# Patient Record
Sex: Male | Born: 1990 | Race: White | Hispanic: No | Marital: Married | Smoking: Current every day smoker
Health system: Southern US, Community
[De-identification: ages and names within clinical notes are randomized; demographics above are authoritative.]

## PROBLEM LIST (undated history)

## (undated) DIAGNOSIS — N201 Calculus of ureter: Secondary | ICD-10-CM

## (undated) HISTORY — PX: TYMPANOSTOMY TUBE PLACEMENT: SHX32

---
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
## 2015-01-03 ENCOUNTER — Emergency Department (HOSPITAL_COMMUNITY)
Admission: EM | Admit: 2015-01-03 | Discharge: 2015-01-04 | Disposition: A | Discharge status: Home / Self Care | Payer: 59 | Attending: Emergency Medicine | Admitting: Emergency Medicine

## 2015-01-03 ENCOUNTER — Emergency Department (HOSPITAL_COMMUNITY): Payer: 59

## 2015-01-03 ENCOUNTER — Encounter (HOSPITAL_COMMUNITY): Payer: Self-pay

## 2015-01-03 DIAGNOSIS — R112 Nausea with vomiting, unspecified: Secondary | ICD-10-CM | POA: Insufficient documentation

## 2015-01-03 DIAGNOSIS — Z720 Tobacco use: Secondary | ICD-10-CM | POA: Insufficient documentation

## 2015-01-03 DIAGNOSIS — R109 Unspecified abdominal pain: Secondary | ICD-10-CM | POA: Diagnosis present

## 2015-01-03 DIAGNOSIS — R1031 Right lower quadrant pain: Secondary | ICD-10-CM

## 2015-01-03 LAB — CBC WITH DIFFERENTIAL/PLATELET
BASOS PCT: 1 % (ref 0–1)
Basophils Absolute: 0 10*3/uL (ref 0.0–0.1)
Eosinophils Absolute: 0.1 10*3/uL (ref 0.0–0.7)
Eosinophils Relative: 1 % (ref 0–5)
HEMATOCRIT: 38.7 % — AB (ref 39.0–52.0)
Hemoglobin: 13.5 g/dL (ref 13.0–17.0)
LYMPHS ABS: 3.6 10*3/uL (ref 0.7–4.0)
LYMPHS PCT: 45 % (ref 12–46)
MCH: 31.9 pg (ref 26.0–34.0)
MCHC: 34.9 g/dL (ref 30.0–36.0)
MCV: 91.5 fL (ref 78.0–100.0)
MONOS PCT: 5 % (ref 3–12)
Monocytes Absolute: 0.4 10*3/uL (ref 0.1–1.0)
NEUTROS ABS: 3.9 10*3/uL (ref 1.7–7.7)
Neutrophils Relative %: 48 % (ref 43–77)
Platelets: 218 10*3/uL (ref 150–400)
RBC: 4.23 MIL/uL (ref 4.22–5.81)
RDW: 12.7 % (ref 11.5–15.5)
WBC: 8.1 10*3/uL (ref 4.0–10.5)

## 2015-01-03 LAB — BASIC METABOLIC PANEL
Anion gap: 9 (ref 5–15)
BUN: 12 mg/dL (ref 6–23)
CHLORIDE: 106 mmol/L (ref 96–112)
CO2: 27 mmol/L (ref 19–32)
CREATININE: 1.01 mg/dL (ref 0.50–1.35)
Calcium: 9.2 mg/dL (ref 8.4–10.5)
GFR calc Af Amer: >90 mL/min (ref >90)
GFR calc non Af Amer: >90 mL/min (ref >90)
Glucose, Bld: 119 mg/dL — ABNORMAL HIGH (ref 70–99)
POTASSIUM: 3.5 mmol/L (ref 3.5–5.1)
Sodium: 142 mmol/L (ref 135–145)

## 2015-01-03 MED ORDER — MORPHINE SULFATE 4 MG/ML IJ SOLN
4.0000 mg | Freq: Once | INTRAMUSCULAR | Status: AC
  Administered 2015-01-03: 4 mg via INTRAVENOUS
  Filled 2015-01-03: qty 1

## 2015-01-03 MED ORDER — ONDANSETRON HCL 4 MG/2ML IJ SOLN
INTRAMUSCULAR | Status: AC
  Administered 2015-01-03: 4 mg via INTRAVENOUS
  Filled 2015-01-03: qty 2

## 2015-01-03 MED ORDER — ONDANSETRON HCL 4 MG/2ML IJ SOLN
4.0000 mg | Freq: Once | INTRAMUSCULAR | Status: AC
  Administered 2015-01-03: 4 mg via INTRAVENOUS
  Filled 2015-01-03: qty 2

## 2015-01-03 NOTE — ED Notes (Signed)
Right lower abd pain with nausea patient unable to sit d/t pain

## 2015-01-03 NOTE — ED Provider Notes (Signed)
CSN: [id number]     Arrival date & time 3/[id number]/16  2128 History   This chart was scribed for Phillip Lyonsouglas Abriel Hattery, MD by Evon Slackerrance Branch, ED Scribe. This patient was seen in room APA07/APA07 and the patient's care was started at 11:01 PM.      Chief Complaint  Patient presents with  . Abdominal Pain   Patient is a 24 y.o. male presenting with abdominal pain. The history is provided by the patient. No language interpreter was used.  Abdominal Pain Pain location:  RLQ Associated symptoms: nausea and vomiting    HPI Comments: Phillip Hansen is a 24 y.o. male who presents to the Emergency Department complaining of worseing  low abdominal pain onset today around lunch time. Pt states that he has associated nausea and vomiting. Pt states that the pain is constant and worsened today around 4PM. Pt doesn't report any medications PTA. Pt doesn't report any alleviating factors. Denies diarrhea, constipation, difficulty urinating, hematuria or dysuria.   Denies any abdominal surgeries.   History reviewed. No pertinent past medical history. History reviewed. No pertinent past surgical history. History reviewed. No pertinent family history. History  Substance Use Topics  . Smoking status: Current Every Day Smoker    Types: Cigarettes  . Smokeless tobacco: Not on file  . Alcohol Use: No    Review of Systems  Gastrointestinal: Positive for nausea, vomiting and abdominal pain.  All other systems reviewed and are negative.     Allergies  Vantin  Home Medications   Prior to Admission medications   Not on File   BP 139/83 mmHg  Pulse 54  Temp(Src) 97.8 F (36.6 C) (Oral)  Resp 20  Ht 6' (1.829 m)  Wt 150 lb (68.04 kg)  BMI 20.34 kg/m2  SpO2 100% Physical Exam  Constitutional: He is oriented to person, place, and time. He appears well-developed and well-nourished. No distress.  HENT:  Head: Normocephalic and atraumatic.  Eyes: Conjunctivae and EOM are normal.  Neck: Neck supple. No tracheal  deviation present.  Cardiovascular: Normal rate.   Pulmonary/Chest: Effort normal. No respiratory distress.  Abdominal: There is tenderness in the right lower quadrant.  Musculoskeletal: Normal range of motion.  Neurological: He is alert and oriented to person, place, and time.  Skin: Skin is warm and dry.  Psychiatric: He has a normal mood and affect. His behavior is normal.  Nursing note and vitals reviewed.   ED Course  Procedures (including critical care time) Labs Review Labs Reviewed  CBC WITH DIFFERENTIAL/PLATELET - Abnormal; Notable for the following:    HCT 38.7 (*)    All other components within normal limits  BASIC METABOLIC PANEL - Abnormal; Notable for the following:    Glucose, Bld 119 (*)    All other components within normal limits    Imaging Review No results found.   EKG Interpretation None      MDM   Final diagnoses:  None    Patient is a [age] year old male who presents with complaints of right lower quadrant pain that started earlier today. He is afebrile, has no white count, however is significantly tender in the right lower quadrant. For this reason, a CT scan of the abdomen and pelvis was obtained which did not reveal evidence for appendicitis or other acute process. He did have dark urine which tested positive for blood. The significance of this I am uncertain as he is not having any urinary complaints. It is quite possible that he had a kidney stone which  may have passed prior to the CT scan being performed. He is feeling better with medications given in the ER and I believe is appropriate for discharge. He will be provided with pain medication and advised to return if his symptoms worsen or change.    Phillip Lyons, MD 01/04/15 [phone]

## 2015-01-04 LAB — URINE MICROSCOPIC-ADD ON

## 2015-01-04 LAB — URINALYSIS, ROUTINE W REFLEX MICROSCOPIC
Glucose, UA: NEGATIVE mg/dL
Leukocytes, UA: NEGATIVE
NITRITE: NEGATIVE
PROTEIN: 100 mg/dL — AB
Specific Gravity, Urine: >1.03 — ABNORMAL HIGH (ref 1.005–1.030)
Urobilinogen, UA: 0.2 mg/dL (ref 0.0–1.0)
pH: 5.5 (ref 5.0–8.0)

## 2015-01-04 LAB — CK: Total CK: 179 U/L (ref 7–232)

## 2015-01-04 MED ORDER — IOHEXOL 300 MG/ML  SOLN
100.0000 mL | Freq: Once | INTRAMUSCULAR | Status: AC | PRN
  Administered 2015-01-04: 100 mL via INTRAVENOUS

## 2015-01-04 MED ORDER — IOHEXOL 300 MG/ML  SOLN
50.0000 mL | Freq: Once | INTRAMUSCULAR | Status: AC | PRN
  Administered 2015-01-04: 50 mL via ORAL

## 2015-01-04 MED ORDER — HYDROCODONE-ACETAMINOPHEN 5-325 MG PO TABS: 1.0000 | ORAL_TABLET | Freq: Four times a day (QID) | ORAL | Status: DC | PRN

## 2015-01-04 MED ORDER — ONDANSETRON HCL 4 MG/2ML IJ SOLN
4.0000 mg | Freq: Once | INTRAMUSCULAR | Status: AC
  Administered 2015-01-03: 4 mg via INTRAVENOUS

## 2015-01-04 NOTE — Discharge Instructions (Signed)
Drink plenty of fluids and get plenty of rest.  Hydrocodone as prescribed as needed for pain.  Return to the emergency department if you develop severe abdominal pain, bloody stool, or other new and concerning symptoms.   Abdominal Pain Many things can cause abdominal pain. Usually, abdominal pain is not caused by a disease and will improve without treatment. It can often be observed and treated at home. Your health care provider will do a physical exam and possibly order blood tests and X-rays to help determine the seriousness of your pain. However, in many cases, more time must pass before a clear cause of the pain can be found. Before that point, your health care provider may not know if you need more testing or further treatment. HOME CARE INSTRUCTIONS  Monitor your abdominal pain for any changes. The following actions may help to alleviate any discomfort you are experiencing:  Only take over-the-counter or prescription medicines as directed by your health care provider.  Do not take laxatives unless directed to do so by your health care provider.  Try a clear liquid diet (broth, tea, or water) as directed by your health care provider. Slowly move to a bland diet as tolerated. SEEK MEDICAL CARE IF:  You have unexplained abdominal pain.  You have abdominal pain associated with nausea or diarrhea.  You have pain when you urinate or have a bowel movement.  You experience abdominal pain that wakes you in the night.  You have abdominal pain that is worsened or improved by eating food.  You have abdominal pain that is worsened with eating fatty foods.  You have a fever. SEEK IMMEDIATE MEDICAL CARE IF:   Your pain does not go away within 2 hours.  You keep throwing up (vomiting).  Your pain is felt only in portions of the abdomen, such as the right side or the left lower portion of the abdomen.  You pass bloody or black tarry stools. MAKE SURE YOU:  Understand these  instructions.   Will watch your condition.   Will get help right away if you are not doing well or get worse.  Document Released: 07/03/2005 Document Revised: 09/28/2013 Document Reviewed: 06/02/2013 [facility] Patient Information 2015 [place], [place]. This information is not intended to replace advice given to you by your health care provider. Make sure you discuss any questions you have with your health care provider.

## 2015-01-04 NOTE — ED Notes (Signed)
Pt gone over to CT 

## 2015-01-06 ENCOUNTER — Emergency Department (HOSPITAL_COMMUNITY): Payer: 59

## 2015-01-06 ENCOUNTER — Encounter (HOSPITAL_COMMUNITY): Payer: Self-pay | Admitting: Emergency Medicine

## 2015-01-06 ENCOUNTER — Emergency Department (HOSPITAL_COMMUNITY)
Admission: EM | Admit: 2015-01-06 | Discharge: 2015-01-06 | Disposition: A | Discharge status: Home / Self Care | Payer: 59 | Attending: Emergency Medicine | Admitting: Emergency Medicine

## 2015-01-06 DIAGNOSIS — N200 Calculus of kidney: Secondary | ICD-10-CM | POA: Insufficient documentation

## 2015-01-06 DIAGNOSIS — R9389 Abnormal findings on diagnostic imaging of other specified body structures: Secondary | ICD-10-CM

## 2015-01-06 DIAGNOSIS — Z720 Tobacco use: Secondary | ICD-10-CM | POA: Insufficient documentation

## 2015-01-06 DIAGNOSIS — R1031 Right lower quadrant pain: Secondary | ICD-10-CM | POA: Diagnosis present

## 2015-01-06 LAB — CBC WITH DIFFERENTIAL/PLATELET
BASOS ABS: 0 10*3/uL (ref 0.0–0.1)
Basophils Relative: 0 % (ref 0–1)
EOS PCT: 2 % (ref 0–5)
Eosinophils Absolute: 0.1 10*3/uL (ref 0.0–0.7)
HCT: 38.3 % — ABNORMAL LOW (ref 39.0–52.0)
Hemoglobin: 13.4 g/dL (ref 13.0–17.0)
Lymphocytes Relative: 44 % (ref 12–46)
Lymphs Abs: 2.4 10*3/uL (ref 0.7–4.0)
MCH: 32 pg (ref 26.0–34.0)
MCHC: 35 g/dL (ref 30.0–36.0)
MCV: 91.4 fL (ref 78.0–100.0)
Monocytes Absolute: 0.4 10*3/uL (ref 0.1–1.0)
Monocytes Relative: 7 % (ref 3–12)
NEUTROS ABS: 2.5 10*3/uL (ref 1.7–7.7)
Neutrophils Relative %: 47 % (ref 43–77)
Platelets: 200 10*3/uL (ref 150–400)
RBC: 4.19 MIL/uL — ABNORMAL LOW (ref 4.22–5.81)
RDW: 12.6 % (ref 11.5–15.5)
WBC: 5.5 10*3/uL (ref 4.0–10.5)

## 2015-01-06 LAB — COMPREHENSIVE METABOLIC PANEL
ALT: 12 U/L (ref 0–53)
ANION GAP: 7 (ref 5–15)
AST: 19 U/L (ref 0–37)
Albumin: 4.3 g/dL (ref 3.5–5.2)
Alkaline Phosphatase: 56 U/L (ref 39–117)
BUN: 12 mg/dL (ref 6–23)
CALCIUM: 9 mg/dL (ref 8.4–10.5)
CO2: 28 mmol/L (ref 19–32)
Chloride: 105 mmol/L (ref 96–112)
Creatinine, Ser: 0.96 mg/dL (ref 0.50–1.35)
GLUCOSE: 108 mg/dL — AB (ref 70–99)
Potassium: 3.7 mmol/L (ref 3.5–5.1)
SODIUM: 140 mmol/L (ref 135–145)
Total Bilirubin: 0.6 mg/dL (ref 0.3–1.2)
Total Protein: 6.5 g/dL (ref 6.0–8.3)

## 2015-01-06 LAB — URINALYSIS, ROUTINE W REFLEX MICROSCOPIC
GLUCOSE, UA: NEGATIVE mg/dL
Ketones, ur: NEGATIVE mg/dL
LEUKOCYTES UA: NEGATIVE
Nitrite: NEGATIVE
PROTEIN: 100 mg/dL — AB
Specific Gravity, Urine: >1.03 — ABNORMAL HIGH (ref 1.005–1.030)
UROBILINOGEN UA: 0.2 mg/dL (ref 0.0–1.0)
pH: 5.5 (ref 5.0–8.0)

## 2015-01-06 LAB — URINE MICROSCOPIC-ADD ON

## 2015-01-06 LAB — LIPASE, BLOOD: Lipase: 20 U/L (ref 11–59)

## 2015-01-06 MED ORDER — KETOROLAC TROMETHAMINE 30 MG/ML IJ SOLN
30.0000 mg | Freq: Once | INTRAMUSCULAR | Status: AC
  Administered 2015-01-06: 30 mg via INTRAVENOUS
  Filled 2015-01-06: qty 1

## 2015-01-06 MED ORDER — HYDROMORPHONE HCL 1 MG/ML IJ SOLN
1.0000 mg | Freq: Once | INTRAMUSCULAR | Status: AC
  Administered 2015-01-06: 1 mg via INTRAVENOUS
  Filled 2015-01-06: qty 1

## 2015-01-06 MED ORDER — PROMETHAZINE HCL 25 MG PO TABS: 25.0000 mg | ORAL_TABLET | Freq: Four times a day (QID) | ORAL | Status: AC | PRN

## 2015-01-06 MED ORDER — OXYCODONE-ACETAMINOPHEN 5-325 MG PO TABS: 1.0000 | ORAL_TABLET | ORAL | Status: AC | PRN

## 2015-01-06 MED ORDER — SODIUM CHLORIDE 0.9 % IV SOLN
INTRAVENOUS | Status: DC
  Administered 2015-01-06: 08:00:00 via INTRAVENOUS

## 2015-01-06 MED ORDER — TAMSULOSIN HCL 0.4 MG PO CAPS: 0.4000 mg | ORAL_CAPSULE | Freq: Every day | ORAL | Status: DC

## 2015-01-06 MED ORDER — KETOROLAC TROMETHAMINE 10 MG PO TABS: 10.0000 mg | ORAL_TABLET | Freq: Four times a day (QID) | ORAL | Status: DC | PRN

## 2015-01-06 MED ORDER — HYDROMORPHONE HCL 1 MG/ML IJ SOLN
1.0000 mg | Freq: Once | INTRAMUSCULAR | Status: AC
  Administered 2015-01-06: 1 mg via INTRAVENOUS

## 2015-01-06 MED ORDER — HYDROMORPHONE HCL 1 MG/ML IJ SOLN
INTRAMUSCULAR | Status: AC
  Filled 2015-01-06: qty 1

## 2015-01-06 MED ORDER — ONDANSETRON HCL 4 MG/2ML IJ SOLN
4.0000 mg | Freq: Once | INTRAMUSCULAR | Status: AC
  Administered 2015-01-06: 4 mg via INTRAVENOUS
  Filled 2015-01-06: qty 2

## 2015-01-06 MED ORDER — TAMSULOSIN HCL 0.4 MG PO CAPS
0.4000 mg | ORAL_CAPSULE | Freq: Once | ORAL | Status: AC
  Administered 2015-01-06: 0.4 mg via ORAL
  Filled 2015-01-06: qty 1

## 2015-01-06 NOTE — ED Provider Notes (Signed)
CSN: [id number]     Arrival date & time 01/06/15  [id number] History   First MD Initiated Contact with Patient 01/06/15 [phone]     Chief Complaint  Patient presents with  . Abdominal Pain     (Consider location/radiation/quality/duration/timing/severity/associated sxs/prior Treatment) Patient is a 24 y.o. male presenting with abdominal pain. The history is provided by the patient.  Abdominal Pain Pain location:  RLQ Pain quality: fullness and sharp   Pain radiates to:  Does not radiate Pain severity:  Severe Onset quality:  Gradual Duration:  4 days Timing:  Constant Progression:  Worsening Chronicity:  New Relieved by:  Nothing Worsened by:  Nothing tried Ineffective treatments:  None tried Associated symptoms: anorexia, nausea and vomiting   Associated symptoms: no belching, no constipation and no diarrhea    Phillip Hansen is a 24 y.o. male who presents to the ED with abdominal pain. The pain started 4 days ago. He was evaluated in the ED 01/03/15 and had a CT scan that was normal. He was d/c home with hydrocodone for his pain. He did have hematuria on his last visit that brought concern for possible kidney stone that may have passed prior to going to CT. The patient has continued to have severe pain that he rates 10/10 despite taking the hydrocodone. He has been unable to keep any food down since his previous visit. He denies flank pain or UTI symptoms.   History reviewed. No pertinent past medical history. History reviewed. No pertinent past surgical history. No family history on file. History  Substance Use Topics  . Smoking status: Current Every Day Smoker    Types: Cigarettes  . Smokeless tobacco: Not on file  . Alcohol Use: No    Review of Systems  Gastrointestinal: Positive for nausea, vomiting, abdominal pain and anorexia. Negative for diarrhea and constipation.  all other systems negative    Allergies  Vantin  Home Medications   Prior to Admission medications    Medication Sig Start Date End Date Taking? Authorizing Provider  HYDROcodone-acetaminophen (NORCO) 5-325 MG per tablet Take 1-2 tablets by mouth every 6 (six) hours as needed. Patient not taking: Reported on 01/06/2015 01/04/15   Phillip Lyonsouglas Delo, MD  ketorolac (TORADOL) 10 MG tablet Take 1 tablet (10 mg total) by mouth every 6 (six) hours as needed. 01/06/15   Kemya Shed Orlene OchM Vinod Mikesell, NP  oxyCODONE-acetaminophen (ROXICET) 5-325 MG per tablet Take 1 tablet by mouth every 4 (four) hours as needed for severe pain. 01/06/15   Mescal Flinchbaugh Orlene OchM Tyaisha Cullom, NP  promethazine (PHENERGAN) 25 MG tablet Take 1 tablet (25 mg total) by mouth every 6 (six) hours as needed for nausea or vomiting. 01/06/15   Chong Wojdyla Orlene OchM Fuquan Wilson, NP  tamsulosin (FLOMAX) 0.4 MG CAPS capsule Take 1 capsule (0.4 mg total) by mouth daily. 01/06/15   Fateh Kindle Orlene OchM Leno Mathes, NP   BP 99/63 mmHg  Pulse 49  Temp(Src) 98.3 F (36.8 C) (Oral)  Resp 20  Ht 6' (1.829 m)  Wt 150 lb (68.04 kg)  BMI 20.34 kg/m2  SpO2 100% Physical Exam  Constitutional: He is oriented to person, place, and time. He appears well-developed and well-nourished.  Patient appears to be extremely uncomfortable.  HENT:  Head: Normocephalic.  Eyes: Conjunctivae and EOM are normal.  Neck: Neck supple.  Cardiovascular: Normal rate and regular rhythm.   Pulmonary/Chest: Effort normal and breath sounds normal.  Abdominal: Soft. Bowel sounds are normal. There is tenderness in the right upper quadrant, right lower quadrant and epigastric area.  There is no CVA tenderness.  Right flank pain  Musculoskeletal: Normal range of motion.  Neurological: He is alert and oriented to person, place, and time. No cranial nerve deficit.  Skin: Skin is warm and dry.  Psychiatric: He has a normal mood and affect. His behavior is normal.  Nursing note and vitals reviewed.   ED Course  Procedures (including critical care time) Labs, Ultrasound, CT of abdomen, Pain management, medication for nausea  Labs Review No results found for  this or any previous visit (from the past 24 hour(s)).  Imaging Review US Abdomen Limited  01/06/2015   CLINICAL DATA:  Four-day history of right upper quadrant pain with nausea and vomiting  EXAM: US ABDOMEN LIMITED - RIGHT UPPER QUADRANT  COMPARISON:  CT scan of the abdomen and pelvis dated January 04, 2015  FINDINGS: Gallbladder:  No gallstones or wall thickening visualized. No sonographic Murphy sign noted.  Common bile duct:  Diameter: 1.9 mm  Liver:  No focal lesion identified. Within normal limits in parenchymal echogenicity. There is no intrahepatic ductal dilation.  Incidental note is made of increased echogenicity of the cortex of the right kidney. There is mild splitting of the central echo complex consistent with mild hydronephrosis.  IMPRESSION: 1. There is no acute abnormality of the liver, gallbladder, or common bile duct. 2. There is mild right-sided hydronephrosis as well as increased cortical echotexture. The latter finding may reflect medical renal disease.   Electronically Signed   By: David  [place]   On: 01/06/2015 09:26   Ct Renal Stone Study  01/06/2015   CLINICAL DATA:  Mild right hydronephrosis, right flank pain  EXAM: CT ABDOMEN AND PELVIS WITHOUT CONTRAST  TECHNIQUE: Multidetector CT imaging of the abdomen and pelvis was performed following the standard protocol without IV contrast.  COMPARISON:  01/04/2015 and ultrasound 01/06/2015  FINDINGS: Lung bases are unremarkable.  Sagittal images of the spine are unremarkable.  Unenhanced liver shows no biliary ductal dilatation.  No aortic aneurysm. No calcified gallstones are noted within gallbladder. Unenhanced pancreas, spleen and adrenal glands are unremarkable. There is mild right hydronephrosis. Mild proximal right hydroureter.  Axial image 47 there is a calcified calculus in mid right ureter at the level of L5-S1 disc space measures 3 mm.  Moderate stool noted in right colon. There is a low lying cecum. Normal appendix partially  visualized in coronal image 40. No pericecal inflammation. Moderate stool and gas is noted in transverse colon.  Small amount of free fluid noted posterior pelvis. Urinary bladder is under distended grossly unremarkable. No small bowel obstruction.  IMPRESSION: 1. There is mild right hydronephrosis and proximal right hydroureter. 2. Axial image 47 there is 3 mm calcified calculus in mid right ureter at the L4-L5 disc space level. 3. Low lying cecum. No pericecal inflammation. Normal appendix partially visualized. 4. No small bowel obstruction. 5. Small amount of free fluid noted posterior cul-de-sac.   Electronically Signed   By: Natasha Mead M.D.   On: 01/06/2015 12:14   After Dilaudid 1 mg  And Zofran 61m IV, the pain decreased but returned, second dose of Dilaudid given and pain improved.  Dr. Adriana Simas in to examine the patient. Will repeat CT since Ultrasound today shows hydronephrosis that was not mentioned on previous CT and pain has worsened since previous visit.   When patient returned from CT the pain had returned and patient given Toradol 30 mg IV. He states that really helped the pain. He is feeling much better  after the Toradol.   MDM  24 y.o. male with continued right side abdominal pain and right flank with n/v since previous visit. Will treat for kidney stone and he will follow up with [facility]. Rx given for Toradol, Percocet, Phenergan and Flomax. Stable for d/c without n/v or pain at this time. I have reviewed this patient's vital signs, nurses notes, appropriate labs and imaging. I have discussed findings with the patient and plan of care. He will return for worsening symptoms.    Final diagnoses:  Kidney stone        [facility], NP 01/07/15 1610  Donnetta Hutching, MD 01/10/15 1247

## 2015-01-06 NOTE — ED Notes (Signed)
Pt alert & oriented x4, stable gait. Patient given discharge instructions, paperwork & prescription(s). Patient  instructed to stop at the registration desk to finish any additional paperwork. Patient verbalized understanding. Pt left department w/ no further questions. 

## 2015-01-06 NOTE — Discharge Instructions (Signed)
Call [facility] to schedule a follow up appointment. Return here as needed.   Kidney Stones Kidney stones (urolithiasis) are deposits that form inside your kidneys. The intense pain is caused by the stone moving through the urinary tract. When the stone moves, the ureter goes into spasm around the stone. The stone is usually passed in the urine.  CAUSES   A disorder that makes certain neck glands produce too much parathyroid hormone (primary hyperparathyroidism).  A buildup of uric acid crystals, similar to gout in your joints.  Narrowing (stricture) of the ureter.  A kidney obstruction present at birth (congenital obstruction).  Previous surgery on the kidney or ureters.  Numerous kidney infections. SYMPTOMS   Feeling sick to your stomach (nauseous).  Throwing up (vomiting).  Blood in the urine (hematuria).  Pain that usually spreads (radiates) to the groin.  Frequency or urgency of urination. DIAGNOSIS   Taking a history and physical exam.  Blood or urine tests.  CT scan.  Occasionally, an examination of the inside of the urinary bladder (cystoscopy) is performed. TREATMENT   Observation.  Increasing your fluid intake.  Extracorporeal shock wave lithotripsy--This is a noninvasive procedure that uses shock waves to break up kidney stones.  Surgery may be needed if you have severe pain or persistent obstruction. There are various surgical procedures. Most of the procedures are performed with the use of small instruments. Only small incisions are needed to accommodate these instruments, so recovery time is minimized. The size, location, and chemical composition are all important variables that will determine the proper choice of action for you. Talk to your health care provider to better understand your situation so that you will minimize the risk of injury to yourself and your kidney.  HOME CARE INSTRUCTIONS   Drink enough water and fluids to keep your urine  clear or pale yellow. This will help you to pass the stone or stone fragments.  Strain all urine through the provided strainer. Keep all particulate matter and stones for your health care provider to see. The stone causing the pain may be as small as a grain of salt. It is very important to use the strainer each and every time you pass your urine. The collection of your stone will allow your health care provider to analyze it and verify that a stone has actually passed. The stone analysis will often identify what you can do to reduce the incidence of recurrences.  Only take over-the-counter or prescription medicines for pain, discomfort, or fever as directed by your health care provider.  Make a follow-up appointment with your health care provider as directed.  Get follow-up X-rays if required. The absence of pain does not always mean that the stone has passed. It may have only stopped moving. If the urine remains completely obstructed, it can cause loss of kidney function or even complete destruction of the kidney. It is your responsibility to make sure X-rays and follow-ups are completed. Ultrasounds of the kidney can show blockages and the status of the kidney. Ultrasounds are not associated with any radiation and can be performed easily in a matter of minutes. SEEK MEDICAL CARE IF:  You experience pain that is progressive and unresponsive to any pain medicine you have been prescribed. SEEK IMMEDIATE MEDICAL CARE IF:   Pain cannot be controlled with the prescribed medicine.  You have a fever or shaking chills.  The severity or intensity of pain increases over 18 hours and is not relieved by pain medicine.  You develop a new onset of abdominal pain.  You feel faint or pass out.  You are unable to urinate. MAKE SURE YOU:   Understand these instructions.  Will watch your condition.  Will get help right away if you are not doing well or get worse. Document Released: 09/23/2005 Document  Revised: 05/26/2013 Document Reviewed: 02/24/2013 [facility] Patient Information 2015 [place], [place]. This information is not intended to replace advice given to you by your health care provider. Make sure you discuss any questions you have with your health care provider.

## 2015-01-06 NOTE — ED Notes (Signed)
Pt c/o continued right side abd pain with vomiting. lnbm 3/29. Pt seen in ED 3/29 for same.

## 2015-01-24 ENCOUNTER — Other Ambulatory Visit: Payer: Self-pay | Admitting: Urology

## 2015-01-24 ENCOUNTER — Ambulatory Visit (HOSPITAL_COMMUNITY)
Admission: RE | Admit: 2015-01-24 | Discharge: 2015-01-24 | Disposition: A | Discharge status: Home / Self Care | Payer: 59 | Source: Ambulatory Visit | Attending: Urology | Admitting: Urology

## 2015-01-24 ENCOUNTER — Ambulatory Visit (INDEPENDENT_AMBULATORY_CARE_PROVIDER_SITE_OTHER): Payer: 59 | Admitting: Urology

## 2015-01-24 DIAGNOSIS — N201 Calculus of ureter: Secondary | ICD-10-CM | POA: Insufficient documentation

## 2015-01-24 DIAGNOSIS — N475 Adhesions of prepuce and glans penis: Secondary | ICD-10-CM

## 2015-01-24 DIAGNOSIS — R109 Unspecified abdominal pain: Secondary | ICD-10-CM | POA: Insufficient documentation

## 2015-01-26 ENCOUNTER — Other Ambulatory Visit: Payer: Self-pay | Admitting: Urology

## 2015-01-26 ENCOUNTER — Encounter (HOSPITAL_BASED_OUTPATIENT_CLINIC_OR_DEPARTMENT_OTHER): Payer: Self-pay | Admitting: *Deleted

## 2015-01-26 NOTE — Progress Notes (Signed)
NPO AFTER MN WITH EXCEPTION WATER/ GATORADE UNTIL 0600. ARRIVE AT 1115. CURRENT LAB RESULTS IN CHART AND EPIC. MAY TAKE PAIN/ NAUSEA AM DOS W/ SIPS OF WATER.

## 2015-01-27 ENCOUNTER — Encounter (HOSPITAL_BASED_OUTPATIENT_CLINIC_OR_DEPARTMENT_OTHER): Payer: Self-pay | Admitting: *Deleted

## 2015-01-27 ENCOUNTER — Ambulatory Visit (HOSPITAL_BASED_OUTPATIENT_CLINIC_OR_DEPARTMENT_OTHER)
Admission: RE | Admit: 2015-01-27 | Discharge: 2015-01-27 | Disposition: A | Discharge status: Home / Self Care | Payer: 59 | Source: Ambulatory Visit | Attending: Urology | Admitting: Urology

## 2015-01-27 ENCOUNTER — Encounter (HOSPITAL_BASED_OUTPATIENT_CLINIC_OR_DEPARTMENT_OTHER)
Admission: RE | Disposition: A | Discharge status: Home / Self Care | Payer: Self-pay | Source: Ambulatory Visit | Attending: Urology

## 2015-01-27 ENCOUNTER — Ambulatory Visit (HOSPITAL_COMMUNITY): Payer: 59

## 2015-01-27 ENCOUNTER — Ambulatory Visit (HOSPITAL_BASED_OUTPATIENT_CLINIC_OR_DEPARTMENT_OTHER): Payer: 59 | Admitting: Anesthesiology

## 2015-01-27 DIAGNOSIS — F17210 Nicotine dependence, cigarettes, uncomplicated: Secondary | ICD-10-CM | POA: Diagnosis not present

## 2015-01-27 DIAGNOSIS — Z881 Allergy status to other antibiotic agents status: Secondary | ICD-10-CM | POA: Insufficient documentation

## 2015-01-27 DIAGNOSIS — R1011 Right upper quadrant pain: Secondary | ICD-10-CM

## 2015-01-27 DIAGNOSIS — N2889 Other specified disorders of kidney and ureter: Secondary | ICD-10-CM | POA: Diagnosis not present

## 2015-01-27 DIAGNOSIS — F1099 Alcohol use, unspecified with unspecified alcohol-induced disorder: Secondary | ICD-10-CM | POA: Insufficient documentation

## 2015-01-27 DIAGNOSIS — R109 Unspecified abdominal pain: Secondary | ICD-10-CM

## 2015-01-27 HISTORY — DX: Calculus of ureter: N20.1

## 2015-01-27 HISTORY — PX: CYSTOSCOPY WITH URETEROSCOPY, STONE BASKETRY AND STENT PLACEMENT: SHX6378

## 2015-01-27 LAB — POCT HEMOGLOBIN-HEMACUE: HEMOGLOBIN: 13 g/dL (ref 13.0–17.0)

## 2015-01-27 SURGERY — CYSTOSCOPY, WITH CALCULUS MANIPULATION OR REMOVAL
Anesthesia: General | Site: Ureter | Laterality: Right

## 2015-01-27 MED ORDER — MIDAZOLAM HCL 5 MG/5ML IJ SOLN
INTRAMUSCULAR | Status: DC | PRN
  Administered 2015-01-27: 2 mg via INTRAVENOUS

## 2015-01-27 MED ORDER — FENTANYL CITRATE (PF) 100 MCG/2ML IJ SOLN
INTRAMUSCULAR | Status: AC
  Filled 2015-01-27: qty 4

## 2015-01-27 MED ORDER — LACTATED RINGERS IV SOLN
INTRAVENOUS | Status: DC
  Filled 2015-01-27: qty 1000

## 2015-01-27 MED ORDER — CIPROFLOXACIN IN D5W 400 MG/200ML IV SOLN
INTRAVENOUS | Status: AC
  Filled 2015-01-27: qty 200

## 2015-01-27 MED ORDER — PROPOFOL 10 MG/ML IV BOLUS
INTRAVENOUS | Status: DC | PRN
  Administered 2015-01-27: 200 mg via INTRAVENOUS

## 2015-01-27 MED ORDER — PROMETHAZINE HCL 25 MG/ML IJ SOLN
INTRAMUSCULAR | Status: AC
  Filled 2015-01-27: qty 1

## 2015-01-27 MED ORDER — OXYCODONE HCL 5 MG PO TABS
ORAL_TABLET | ORAL | Status: AC
  Filled 2015-01-27: qty 1

## 2015-01-27 MED ORDER — KETOROLAC TROMETHAMINE 30 MG/ML IJ SOLN
INTRAMUSCULAR | Status: DC | PRN
  Administered 2015-01-27: 30 mg via INTRAVENOUS

## 2015-01-27 MED ORDER — FENTANYL CITRATE (PF) 100 MCG/2ML IJ SOLN
25.0000 ug | INTRAMUSCULAR | Status: DC | PRN
  Administered 2015-01-27 (×5): 25 ug via INTRAVENOUS
  Filled 2015-01-27: qty 1

## 2015-01-27 MED ORDER — MIDAZOLAM HCL 2 MG/2ML IJ SOLN
INTRAMUSCULAR | Status: AC
  Filled 2015-01-27: qty 2

## 2015-01-27 MED ORDER — PHENAZOPYRIDINE HCL 200 MG PO TABS
200.0000 mg | ORAL_TABLET | Freq: Three times a day (TID) | ORAL | Status: DC | PRN
  Administered 2015-01-27: 200 mg via ORAL
  Filled 2015-01-27: qty 1

## 2015-01-27 MED ORDER — ONDANSETRON HCL 4 MG/2ML IJ SOLN
INTRAMUSCULAR | Status: DC | PRN
  Administered 2015-01-27: 4 mg via INTRAVENOUS

## 2015-01-27 MED ORDER — FENTANYL CITRATE (PF) 100 MCG/2ML IJ SOLN
INTRAMUSCULAR | Status: AC
  Filled 2015-01-27: qty 2

## 2015-01-27 MED ORDER — LIDOCAINE HCL (CARDIAC) 20 MG/ML IV SOLN
INTRAVENOUS | Status: DC | PRN
  Administered 2015-01-27: 100 mg via INTRAVENOUS

## 2015-01-27 MED ORDER — SULFAMETHOXAZOLE-TRIMETHOPRIM 800-160 MG PO TABS: 1.0000 | ORAL_TABLET | Freq: Two times a day (BID) | ORAL | Status: AC

## 2015-01-27 MED ORDER — PROMETHAZINE HCL 25 MG/ML IJ SOLN
6.2500 mg | INTRAMUSCULAR | Status: DC | PRN
  Administered 2015-01-27: 6.25 mg via INTRAVENOUS
  Filled 2015-01-27: qty 1

## 2015-01-27 MED ORDER — PHENAZOPYRIDINE HCL 200 MG PO TABS: 200.0000 mg | ORAL_TABLET | Freq: Three times a day (TID) | ORAL | Status: AC | PRN

## 2015-01-27 MED ORDER — SODIUM CHLORIDE 0.9 % IR SOLN
Status: DC | PRN
  Administered 2015-01-27: 500 mL
  Administered 2015-01-27: 6000 mL

## 2015-01-27 MED ORDER — DEXAMETHASONE SODIUM PHOSPHATE 4 MG/ML IJ SOLN
INTRAMUSCULAR | Status: DC | PRN
  Administered 2015-01-27: 10 mg via INTRAVENOUS

## 2015-01-27 MED ORDER — PHENAZOPYRIDINE HCL 100 MG PO TABS
ORAL_TABLET | ORAL | Status: AC
  Filled 2015-01-27: qty 2

## 2015-01-27 MED ORDER — LACTATED RINGERS IV SOLN
INTRAVENOUS | Status: DC
  Administered 2015-01-27: 16:00:00 via INTRAVENOUS
  Filled 2015-01-27: qty 1000

## 2015-01-27 MED ORDER — OXYCODONE HCL 5 MG PO TABS
5.0000 mg | ORAL_TABLET | Freq: Once | ORAL | Status: AC
  Administered 2015-01-27: 5 mg via ORAL
  Filled 2015-01-27: qty 1

## 2015-01-27 MED ORDER — MEPERIDINE HCL 25 MG/ML IJ SOLN
6.2500 mg | INTRAMUSCULAR | Status: DC | PRN
  Filled 2015-01-27: qty 1

## 2015-01-27 MED ORDER — CIPROFLOXACIN IN D5W 400 MG/200ML IV SOLN
400.0000 mg | INTRAVENOUS | Status: AC
  Administered 2015-01-27: 400 mg via INTRAVENOUS
  Filled 2015-01-27: qty 200

## 2015-01-27 MED ORDER — FENTANYL CITRATE (PF) 100 MCG/2ML IJ SOLN
INTRAMUSCULAR | Status: DC | PRN
  Administered 2015-01-27 (×2): 25 ug via INTRAVENOUS
  Administered 2015-01-27: 50 ug via INTRAVENOUS

## 2015-01-27 MED ORDER — ACETAMINOPHEN 10 MG/ML IV SOLN
INTRAVENOUS | Status: DC | PRN
  Administered 2015-01-27: 1000 mg via INTRAVENOUS

## 2015-01-27 MED ORDER — IOHEXOL 350 MG/ML SOLN
INTRAVENOUS | Status: DC | PRN
  Administered 2015-01-27: 6 mL

## 2015-01-27 MED ORDER — LACTATED RINGERS IV SOLN
INTRAVENOUS | Status: DC
  Administered 2015-01-27: 12:00:00 via INTRAVENOUS
  Filled 2015-01-27: qty 1000

## 2015-01-27 SURGICAL SUPPLY — 18 items
BAG DRAIN URO-CYSTO SKYTR STRL (DRAIN) ×4 IMPLANT
CANISTER SUCT LVC 12 LTR MEDI- (MISCELLANEOUS) ×4 IMPLANT
CATH INTERMIT  6FR 70CM (CATHETERS) ×4 IMPLANT
CLOTH BEACON ORANGE TIMEOUT ST (SAFETY) ×4 IMPLANT
FIBER LASER FLEXIVA 200 (UROLOGICAL SUPPLIES) IMPLANT
GLOVE BIO SURGEON STRL SZ 6.5 (GLOVE) ×3 IMPLANT
GLOVE BIO SURGEON STRL SZ8 (GLOVE) ×4 IMPLANT
GLOVE BIO SURGEONS STRL SZ 6.5 (GLOVE) ×1
GLOVE BIOGEL PI IND STRL 6.5 (GLOVE) ×4 IMPLANT
GLOVE BIOGEL PI INDICATOR 6.5 (GLOVE) ×4
GOWN STRL REUS W/ TWL LRG LVL3 (GOWN DISPOSABLE) ×2 IMPLANT
GOWN STRL REUS W/ TWL XL LVL3 (GOWN DISPOSABLE) ×2 IMPLANT
GOWN STRL REUS W/TWL LRG LVL3 (GOWN DISPOSABLE) ×2
GOWN STRL REUS W/TWL XL LVL3 (GOWN DISPOSABLE) ×2
GUIDEWIRE STR DUAL SENSOR (WIRE) ×4 IMPLANT
IV NS IRRIG 3000ML ARTHROMATIC (IV SOLUTION) ×8 IMPLANT
PACK CYSTO (CUSTOM PROCEDURE TRAY) ×4 IMPLANT
SHEATH ACCESS URETERAL 38CM (SHEATH) ×4 IMPLANT

## 2015-01-27 NOTE — H&P (Signed)
Urology History and Physical Exam  CC: Kidney stone  HPI: 24 year old male presents for ureteroscopic management of a persistently symptomatic right mid ureteral stone. He presented to my [place] office earlier this week w/ a 3 week h/o right ureteral colic. He has a 3-4 mm stone that is not progressing.  PMH: Past Medical History  Diagnosis Date  . Right ureteral stone     PSH: Past Surgical History  Procedure Laterality Date  . Tympanostomy tube placement Bilateral x2   as child  . Repair  laceration and tendon's left index finger  2006    Allergies: Allergies  Allergen Reactions  . Vantin [Cefpodoxime] Other (See Comments)    Unknown childhood reaction     Medications: No prescriptions prior to admission     Social History: History   Social History  . Marital Status: Single    Spouse Name: N/A  . Number of Children: N/A  . Years of Education: N/A   Occupational History  . Not on file.   Social History Main Topics  . Smoking status: Current Every Day Smoker -- 1.00 packs/day for 4 years    Types: Cigarettes  . Smokeless tobacco: Never Used  . Alcohol Use: Yes     Comment: rare  . Drug Use: No  . Sexual Activity: Not on file   Other Topics Concern  . Not on file   Social History Narrative    Family History: History reviewed. No pertinent family history.  Review of Systems: Positive:Right flan pain, N/V Negative:  A further 10 point review of systems was negative except what is listed in the HPI.                  Physical Exam: @VITALS2 @ General: No acute distress.  Awake. Head:  Normocephalic.  Atraumatic. ENT:  EOMI.  Mucous membranes moist Neck:  Supple.  No lymphadenopathy. CV:  S1 present. S2 present. Regular rate. Pulmonary: Equal effort bilaterally.  Clear to auscultation bilaterally. Abdomen: Soft.   Tender to palpation in RUQ and RLQ. Skin:  Normal turgor.  No visible rash. Extremity: No gross deformity of bilateral upper  extremities.  No gross deformity of    lower extremities. Neurologic: Alert. Appropriate mood.  Penis:  Circumcised.  No lesions. Urethra: Orthotopic meatus. Scrotum: No lesions.  No ecchymosis.  No erythema. Testicles: Descended bilaterally.  No masses bilaterally. Epididymis: Palpable bilaterally. Nontender to palpation.  Studies:  No results for input(s): HGB, WBC, PLT in the last 72 hours.  No results for input(s): NA, K, CL, CO2, BUN, CREATININE, CALCIUM, GFRNONAA, GFRAA in the last 72 hours.  Invalid input(s): MAGNESIUM   No results for input(s): INR, APTT in the last 72 hours.  Invalid input(s): PT   Invalid input(s): ABG    Assessment:  Right midureteral stone  Plan:Right ureteroscopic stone management

## 2015-01-27 NOTE — Anesthesia Procedure Notes (Signed)
Procedure Name: LMA Insertion Date/Time: 01/27/2015 1:47 PM Performed by: Norva PavlovALLAWAY, Phillip Knueppel G Pre-anesthesia Checklist: Patient identified, Emergency Drugs available, Suction available and Patient being monitored Patient Re-evaluated:Patient Re-evaluated prior to inductionOxygen Delivery Method: Circle System Utilized Preoxygenation: Pre-oxygenation with 100% oxygen Intubation Type: IV induction Ventilation: Mask ventilation without difficulty LMA: LMA inserted LMA Size: 5.0 Number of attempts: 1 Airway Equipment and Method: bite block Placement Confirmation: positive ETCO2 Tube secured with: Tape Dental Injury: Teeth and Oropharynx as per pre-operative assessment

## 2015-01-27 NOTE — Anesthesia Postprocedure Evaluation (Signed)
  Anesthesia Post-op Note  Patient: Phillip Hansen  Procedure(s) Performed: Procedure(s): CYSTOSCOPY, RIGHT RETROGRADE PYLEGRAM, RIGHT URETEROSCOPY (Right)  Patient Location: PACU  Anesthesia Type:General  Level of Consciousness: awake and alert   Airway and Oxygen Therapy: Patient Spontanous Breathing  Post-op Pain: moderate  Post-op Assessment: Post-op Vital signs reviewed, Patient's Cardiovascular Status Stable and Respiratory Function Stable  Post-op Vital Signs: Reviewed  Filed Vitals:   01/27/15 1545  BP: 107/70  Pulse: 59  Temp:   Resp: 13    Complications: No apparent anesthesia complications

## 2015-01-27 NOTE — Op Note (Signed)
PATIENT:  Phillip Hansen  PRE-OPERATIVE DIAGNOSIS: right midureteral stone  POST-OPERATIVE DIAGNOSIS: no evidence of stone  PROCEDURE: cystoscopy, right retrograde ureteropyelogram, right ureteroscopy, right pyeloscopy  SURGEON:  Bertram MillardStephen M. Ichael Pullara, M.D.  ANESTHESIA:  General  EBL:  Minimal  DRAINS: None  LOCAL MEDICATIONS USED:  None  SPECIMEN: none   INDICATION: Phillip Hansen is a 24 year old male who [facility] [place] [facility] with persistent flank pain, a history of a right upper/mid ureteral calculus and a KUB revealing possible midureteral stone. Because of persistent pain and him missing work for one month, I offered stone management to him. He has had persistent pain, even in the holding room today. KUB revealed a possible right mid/distal ureteral stone. Because of the patient's persistent pain proceed at this point with ureteroscopy and stone management.  Description of procedure: The patient was properly identified and marked (if applicable) in the holding area. They were then  taken to the operating room and placed on the table in a supine position. General anesthesia was then administered. Once fully anesthetized the patient was moved to the dorsolithotomy position and the genitalia and perineum were sterilely prepped and draped in standard fashion. An official timeout was then performed.  A 22 French panendoscope was advanced to the patient's urethra. All aspects of the urethra were normal. Prostate was nonobstructive. The bladder was entered and inspected circumferentially. No tumors or foreign bodies are trabeculations were noted. The right ureteral orifice was cannulated with a 6 open-ended catheter. Retrograde ureteropyelogram was performed. This revealed a normal ureter throughout its course. There was mild compression of the mid ureter over the sacrum which to me represented either a mild stone or vascular compression. The  pyelocalyceal system was normal. I saw no evidence of significant blockage.  I then negotiated a 6 French rigid ureteroscope through the patient's urethra, bladder and  I cannulated the right ureteral orifice. The scope was moved proximally. I did not see any significant stone along the course of the distal or mid ureter. I could not advance the scope any farther. I then used the single-lumen flexible ureteroscope. This was passed over the guidewire which had been placed  Prior to rigid ureteroscopy. Over top of the guidewire, the ureteroscope was advanced up to the pyelo-calyceal system. All calyces were carefully examined with the scope after the guidewire was removed. I saw no evidence of a stone in any the calyceal structures. The pelvis was also inspected and was found to be normal, without any stone. I then backed the scope out the entire length of the ureter. No stone was seen.  I did not feel like stent was needed. I then drained the bladder, and after the scope and then removed the procedure was terminated. The patient was awakened and taken to the PACU in stable condition.    PLAN OF CARE: Discharge to home after PACU  PATIENT DISPOSITION:  PACU - hemodynamically stable.

## 2015-01-27 NOTE — Discharge Instructions (Signed)
1. You may see some blood in the urine and may have some burning with urination for 48-72 hours. You also may notice that you have to urinate more frequently or urgently after your procedure which is normal.  °2. You should call should you develop an inability urinate, fever > 101, persistent nausea and vomiting that prevents you from eating or drinking to stay hydrated.  °3. If you have a stent, you will likely urinate more frequently and urgently until the stent is removed and you may experience some discomfort/pain in the lower abdomen and flank especially when urinating. You may take pain medication prescribed to you if needed for pain. You may also intermittently have blood in the urine until the stent is removed. °If you have a catheter, you will be taught how to take care of the catheter by the nursing staff prior to discharge from the hospital.  You may periodically feel a strong urge to void with the catheter in place.  This is a bladder spasm and most often can occur when having a bowel movement or moving around. It is typically self-limited and usually will stop after a few minutes.  You may use some Vaseline or Neosporin around the tip of the catheter to reduce friction at the tip of the penis. You may also see some blood in the urine.  A very small amount of blood can make the urine look quite red.  As long as the catheter is draining well, there usually is not a problem.  However, if the catheter is not draining well and is bloody, you should call the office ([phone]) to notify us. °Post Anesthesia Home Care Instructions ° °Activity: °Get plenty of rest for the remainder of the day. A responsible adult should stay with you for 24 hours following the procedure.  °For the next 24 hours, DO NOT: °-Drive a car °-Operate machinery °-Drink alcoholic beverages °-Take any medication unless instructed by your physician °-Make any legal decisions or sign important papers. ° °Meals: °Start with liquid foods  such as gelatin or soup. Progress to regular foods as tolerated. Avoid greasy, spicy, heavy foods. If nausea and/or vomiting occur, drink only clear liquids until the nausea and/or vomiting subsides. Call your physician if vomiting continues. ° °Special Instructions/Symptoms: °Your throat may feel dry or sore from the anesthesia or the breathing tube placed in your throat during surgery. If this causes discomfort, gargle with warm salt water. The discomfort should disappear within 24 hours. ° °If you had a scopolamine patch placed behind your ear for the management of post- operative nausea and/or vomiting: ° °1. The medication in the patch is effective for 72 hours, after which it should be removed.  Wrap patch in a tissue and discard in the trash. Wash hands thoroughly with soap and water. °2. You may remove the patch earlier than 72 hours if you experience unpleasant side effects which may include dry mouth, dizziness or visual disturbances. °3. Avoid touching the patch. Wash your hands with soap and water after contact with the patch. °  °4.  °

## 2015-01-27 NOTE — Anesthesia Preprocedure Evaluation (Signed)
Anesthesia Evaluation  Patient identified by MRN, date of birth, ID band Patient awake    Reviewed: Allergy & Precautions, NPO status , Patient's Chart, lab work & pertinent test results  Airway Mallampati: II  TM Distance: >3 FB Neck ROM: Full    Dental no notable dental hx.    Pulmonary neg pulmonary ROS, Current Smoker,  breath sounds clear to auscultation  Pulmonary exam normal       Cardiovascular negative cardio ROS  Rhythm:Regular Rate:Normal     Neuro/Psych negative neurological ROS  negative psych ROS   GI/Hepatic negative GI ROS, Neg liver ROS,   Endo/Other  negative endocrine ROS  Renal/GU negative Renal ROS  negative genitourinary   Musculoskeletal negative musculoskeletal ROS (+)   Abdominal   Peds negative pediatric ROS (+)  Hematology negative hematology ROS (+)   Anesthesia Other Findings   Reproductive/Obstetrics negative OB ROS                             Anesthesia Physical Anesthesia Plan  ASA: II  Anesthesia Plan: General   Post-op Pain Management:    Induction: Intravenous  Airway Management Planned: LMA  Additional Equipment:   Intra-op Plan:   Post-operative Plan: Extubation in OR  Informed Consent: I have reviewed the patients History and Physical, chart, labs and discussed the procedure including the risks, benefits and alternatives for the proposed anesthesia with the patient or authorized representative who has indicated his/her understanding and acceptance.   Dental advisory given  Plan Discussed with: CRNA  Anesthesia Plan Comments:         Anesthesia Quick Evaluation

## 2015-01-27 NOTE — Transfer of Care (Signed)
Last Vitals:  Filed Vitals:   01/27/15 1135  BP: 129/72  Pulse: 92  Temp: 37.1 C  Resp: 16    Immediate Anesthesia Transfer of Care Note  Patient: Phillip Hansen  Procedure(s) Performed: Procedure(s) (LRB): CYSTOSCOPY, RIGHT RETROGRADE PYLEGRAM, RIGHT URETEROSCOPY (Right)  Patient Location: PACU  Anesthesia Type: General  Level of Consciousness: awake, alert  and oriented  Airway & Oxygen Therapy: Patient Spontanous Breathing and Patient connected to nasal cannula oxygen  Post-op Assessment: Report given to PACU RN and Post -op Vital signs reviewed and stable  Post vital signs: Reviewed and stable  Complications: No apparent anesthesia complications

## 2015-01-30 ENCOUNTER — Encounter (HOSPITAL_BASED_OUTPATIENT_CLINIC_OR_DEPARTMENT_OTHER): Payer: Self-pay | Admitting: Urology

## 2015-02-28 ENCOUNTER — Ambulatory Visit (INDEPENDENT_AMBULATORY_CARE_PROVIDER_SITE_OTHER): Payer: 59 | Admitting: Urology

## 2015-02-28 DIAGNOSIS — N200 Calculus of kidney: Secondary | ICD-10-CM | POA: Diagnosis not present

## 2017-02-15 IMAGING — DX DG ABDOMEN 1V
1 series · 1 of 1 positions shown · non-contrast
Comparison: CT abdomen and pelvis 01/15/2015.

CLINICAL DATA: Right ureteral calculus.  Right flank pain.

EXAM:
ABDOMEN - 1 VIEW

[abdomen supine]
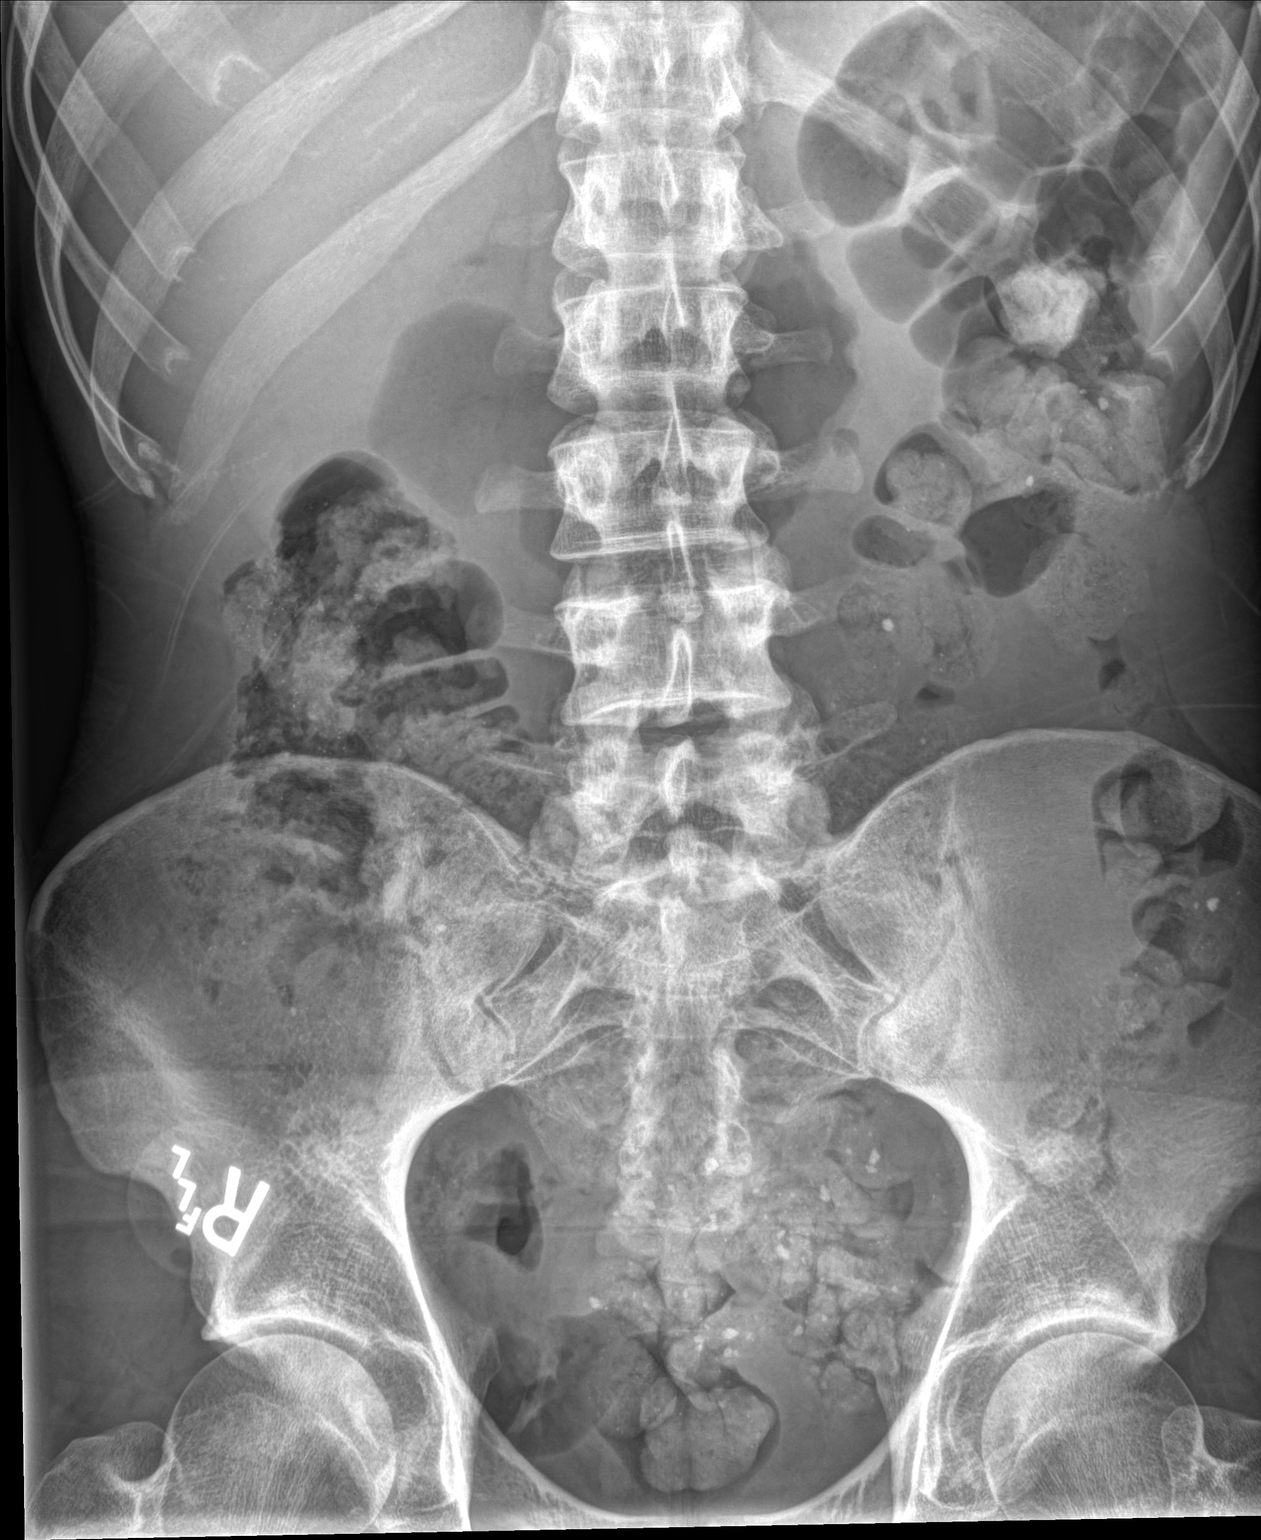

[1 of 1 positions shown; findings below may reference images not displayed]

FINDINGS: A focal density projects over the right SI joint in the region of
the patient's previously seen calcification. Multiple calcifications
are present within the colon which were not previously seen. These
are presumably within the thecal strain. The bowel gas pattern is
otherwise unremarkable. Mild rightward curvature of the lumbar spine
is stable.
IMPRESSION: 1. Multiple calcifications projected throughout the colon are likely
within the lumen. These were not present previously.
2. One of these focal calcifications projects over the right SI
joint and could be the stone previously seen in the right ureter.
3. Mild rightward curvature of the lumbar spine.

## 2020-08-22 ENCOUNTER — Other Ambulatory Visit: Payer: Self-pay

## 2020-08-22 ENCOUNTER — Encounter (HOSPITAL_COMMUNITY): Payer: Self-pay

## 2020-08-22 ENCOUNTER — Emergency Department (HOSPITAL_COMMUNITY): Payer: BC Managed Care – PPO

## 2020-08-22 ENCOUNTER — Emergency Department (HOSPITAL_COMMUNITY)
Admission: EM | Admit: 2020-08-22 | Discharge: 2020-08-22 | Disposition: A | Discharge status: Home / Self Care | Payer: BC Managed Care – PPO | Attending: Emergency Medicine | Admitting: Emergency Medicine

## 2020-08-22 DIAGNOSIS — S63501A Unspecified sprain of right wrist, initial encounter: Secondary | ICD-10-CM | POA: Diagnosis not present

## 2020-08-22 DIAGNOSIS — F17210 Nicotine dependence, cigarettes, uncomplicated: Secondary | ICD-10-CM | POA: Diagnosis not present

## 2020-08-22 DIAGNOSIS — W010XXA Fall on same level from slipping, tripping and stumbling without subsequent striking against object, initial encounter: Secondary | ICD-10-CM | POA: Insufficient documentation

## 2020-08-22 DIAGNOSIS — Y9302 Activity, running: Secondary | ICD-10-CM | POA: Insufficient documentation

## 2020-08-22 DIAGNOSIS — S6991XA Unspecified injury of right wrist, hand and finger(s), initial encounter: Secondary | ICD-10-CM | POA: Diagnosis present

## 2020-08-22 MED ORDER — IBUPROFEN 600 MG PO TABS: 600.0000 mg | ORAL_TABLET | Freq: Three times a day (TID) | ORAL | 0 refills | Status: AC

## 2020-08-22 NOTE — Discharge Instructions (Signed)
Your xrays are negative today for any fracture or dislocation.  I suspect you have a wrist sprain which should resolve with time, along with RICE as discussed (rest, ice, compression and elevation).  Wear the splint for comfort until your pain is improved.  However,  you should get rechecked by Dr. Romeo Apple if your symptoms are not improved within the next 2 weeks as discussed.

## 2020-08-22 NOTE — ED Triage Notes (Signed)
Pt presents to ED with right wrist pain after running and fell on his wrist.

## 2020-08-22 NOTE — ED Provider Notes (Signed)
[facility] EMERGENCY DEPARTMENT Provider Note   CSN: [id number] Arrival date & time: 08/22/20  1023     History Chief Complaint  Patient presents with  . Wrist Pain    Phillip Hansen is a 29 y.o. male, right handed, presenting with right wrist pain after slipping this morning when running after his children, fell backwards on gravel and tried to  "catch" his fall with his right hand behind him.  He reports persistent pain in the wrist since the injury. Denies radiation of pain into the upper forearm or through his hand and fingertips, no numbness.  He has had no treatment prior to arrival.  He denies any other injury.  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 The history is provided by the patient.       Past Medical History:  Diagnosis Date  . Right ureteral stone     There are no problems to display for this patient.   Past Surgical History:  Procedure Laterality Date  . CYSTOSCOPY WITH URETEROSCOPY, STONE BASKETRY AND STENT PLACEMENT Right 01/27/2015   Procedure: CYSTOSCOPY, RIGHT RETROGRADE PYLEGRAM, RIGHT URETEROSCOPY;  Surgeon: Marcine Matar, MD;  Location: [facility];  Service: Urology;  Laterality: Right;  . REPAIR  LACERATION AND TENDON'S LEFT INDEX FINGER  2006  . TYMPANOSTOMY TUBE PLACEMENT Bilateral x2   as child       No family history on file.  Social History   Tobacco Use  . Smoking status: Current Every Day Smoker    Packs/day: 1.00    Years: 4.00    Pack years: 4.00    Types: Cigarettes  . Smokeless tobacco: Never Used  Substance Use Topics  . Alcohol use: Not Currently    Comment: rare  . Drug use: No    Home Medications Prior to Admission medications   Medication Sig Start Date End Date Taking? Authorizing Provider  ibuprofen (ADVIL) 600 MG tablet Take 1 tablet (600 mg total) by mouth 3 (three) times daily. 08/22/20   Burgess Amor, PA-C  Melatonin 3 MG TABS Take 1 tablet by mouth at bedtime as needed.    [provider]  oxyCODONE-acetaminophen (ROXICET) 5-325 MG per tablet Take 1 tablet by mouth every 4 (four) hours as needed for severe pain. 01/06/15   Janne Napoleon, NP  phenazopyridine (PYRIDIUM) 200 MG tablet Take 1 tablet (200 mg total) by mouth 3 (three) times daily as needed for pain. 01/27/15   Marcine Matar, MD  promethazine (PHENERGAN) 25 MG tablet Take 1 tablet (25 mg total) by mouth every 6 (six) hours as needed for nausea or vomiting. 01/06/15   Janne Napoleon, NP  sulfamethoxazole-trimethoprim (BACTRIM DS,SEPTRA DS) 800-160 MG per tablet Take 1 tablet by mouth 2 (two) times daily. 01/27/15   Marcine Matar, MD    Allergies    Vantin [cefpodoxime]  Review of Systems   Review of Systems  Constitutional: Negative for fever.  Musculoskeletal: Positive for arthralgias. Negative for joint swelling and myalgias.  Neurological: Negative for weakness and numbness.  All other systems reviewed and are negative.   Physical Exam Updated Vital Signs BP (!) 150/89 (BP Location: Left Arm)   Pulse 88   Temp 97.7 F (36.5 C) (Oral)   Resp 16   SpO2 100%   Physical Exam Constitutional:      Appearance: He is well-developed.  HENT:     Head: Atraumatic.  Cardiovascular:     Comments: Pulses equal bilaterally Musculoskeletal:  General: Tenderness present. No swelling or deformity.     Cervical back: Normal range of motion.     Comments: ttp dorsal and ventral right wrist without any edema, abrasions/laceration, no bruising or deformity.  Forearm and elbow nontender.  Pt can flex/ext fingers without pain. Cap refill less than 2 secs digits. No snuffbox tenderness.   Skin:    General: Skin is warm and dry.     Capillary Refill: Capillary refill takes less than 2 seconds.     Findings: No erythema or lesion.  Neurological:     General: No focal deficit present.     Mental Status: He is alert.     Sensory: No sensory deficit.     Deep Tendon Reflexes: Reflexes normal.      ED Results / Procedures / Treatments   Labs (all labs ordered are listed, but only abnormal results are displayed) Labs Reviewed - No data to display  EKG None  Radiology DG Wrist Complete Right  Result Date: 08/22/2020 CLINICAL DATA:  Fall. EXAM: RIGHT WRIST - COMPLETE 3+ VIEW COMPARISON:  No recent. FINDINGS: There is no evidence of fracture or dislocation. There is no evidence of arthropathy or other focal bone abnormality. Soft tissues are unremarkable. IMPRESSION: No acute bony or joint abnormality. Electronically Signed   By: Maisie Fus  Register   On: 08/22/2020 10:59    Procedures Procedures (including critical care time)  Medications Ordered in ED Medications - No data to display  ED Course  I have reviewed the triage vital signs and the nursing notes.  Pertinent labs & imaging results that were available during my care of the patient were reviewed by me and considered in my medical decision making (see chart for details).    MDM Rules/Calculators/A&P                          Imaging reveiwed and discussed with pt. Exam, imaging and hx suggesting right wrist sprain. RICE discussed, velcro splint applied.  PRN f/u with ortho, referral given.  No significant ligament injury suspected with no joint instability, no edema or bruising.  No snuffbox tenderness to suggest scaphoid fx.   Final Clinical Impression(s) / ED Diagnoses Final diagnoses:  Sprain of right wrist, initial encounter    Rx / DC Orders ED Discharge Orders         Ordered    ibuprofen (ADVIL) 600 MG tablet  3 times daily        08/22/20 1158           Burgess Amor, Cordelia Poche 08/22/20 1241    Bethann Berkshire, MD 08/25/20 [phone]

## 2022-04-28 ENCOUNTER — Encounter (HOSPITAL_COMMUNITY): Payer: Self-pay

## 2022-04-28 ENCOUNTER — Emergency Department (HOSPITAL_COMMUNITY)
Admission: EM | Admit: 2022-04-28 | Discharge: 2022-04-29 | Disposition: A | Discharge status: Home / Self Care | Payer: BC Managed Care – PPO | Attending: Emergency Medicine | Admitting: Emergency Medicine

## 2022-04-28 ENCOUNTER — Other Ambulatory Visit: Payer: Self-pay

## 2022-04-28 ENCOUNTER — Emergency Department (HOSPITAL_COMMUNITY): Payer: BC Managed Care – PPO

## 2022-04-28 DIAGNOSIS — R1031 Right lower quadrant pain: Secondary | ICD-10-CM | POA: Insufficient documentation

## 2022-04-28 LAB — CBC WITH DIFFERENTIAL/PLATELET
Abs Immature Granulocytes: 0.01 10*3/uL (ref 0.00–0.07)
Basophils Absolute: 0.1 10*3/uL (ref 0.0–0.1)
Basophils Relative: 1 %
Eosinophils Absolute: 0.2 10*3/uL (ref 0.0–0.5)
Eosinophils Relative: 4 %
HCT: 39.3 % (ref 39.0–52.0)
Hemoglobin: 13.9 g/dL (ref 13.0–17.0)
Immature Granulocytes: 0 %
Lymphocytes Relative: 40 %
Lymphs Abs: 2.6 10*3/uL (ref 0.7–4.0)
MCH: 32.2 pg (ref 26.0–34.0)
MCHC: 35.4 g/dL (ref 30.0–36.0)
MCV: 91 fL (ref 80.0–100.0)
Monocytes Absolute: 0.5 10*3/uL (ref 0.1–1.0)
Monocytes Relative: 8 %
Neutro Abs: 3.2 10*3/uL (ref 1.7–7.7)
Neutrophils Relative %: 47 %
Platelets: 261 10*3/uL (ref 150–400)
RBC: 4.32 MIL/uL (ref 4.22–5.81)
RDW: 12.4 % (ref 11.5–15.5)
WBC: 6.6 10*3/uL (ref 4.0–10.5)
nRBC: 0 % (ref 0.0–0.2)

## 2022-04-28 LAB — BASIC METABOLIC PANEL
Anion gap: 6 (ref 5–15)
BUN: 13 mg/dL (ref 6–20)
CO2: 26 mmol/L (ref 22–32)
Calcium: 8.8 mg/dL — ABNORMAL LOW (ref 8.9–10.3)
Chloride: 107 mmol/L (ref 98–111)
Creatinine, Ser: 1.12 mg/dL (ref 0.61–1.24)
GFR, Estimated: >60 mL/min (ref >60)
Glucose, Bld: 96 mg/dL (ref 70–99)
Potassium: 3.4 mmol/L — ABNORMAL LOW (ref 3.5–5.1)
Sodium: 139 mmol/L (ref 135–145)

## 2022-04-28 MED ORDER — DOXYCYCLINE HYCLATE 100 MG PO CAPS: 100.0000 mg | ORAL_CAPSULE | Freq: Two times a day (BID) | ORAL | 0 refills | Status: AC

## 2022-04-28 NOTE — ED Provider Notes (Signed)
  Provider Note MRN:  [id number]  Arrival date & time: 04/29/22    ED Course and Medical Decision Making  Assumed care from Dr. Hyacinth Meeker at shift change.  Right lower quadrant pain awaiting CT to exclude stone.  Also has a rash, question cellulitis, anticipating discharge on doxycycline.  Procedures  Final Clinical Impressions(s) / ED Diagnoses     ICD-10-CM   1. Right lower quadrant abdominal pain  R10.31       ED Discharge Orders          Ordered    doxycycline (VIBRAMYCIN) 100 MG capsule  2 times daily        04/28/22 2326              Discharge Instructions      Your testing did not show any specific abnormalities, your blood work was reassuring and unremarkable.  The CT scan did not show any signs of acute appendicitis or kidney stones or other surgical causes.  Please take the doxycycline twice a day as prescribed, see your doctor within 1 week if no better but come back to the ER immediately for severe or worsening symptoms    Elmer Sow. Pilar Plate, MD [facility] Emergency Medicine [facility] [email or web address]    Sabas Sous, MD 04/29/22 [phone]

## 2022-04-28 NOTE — ED Notes (Signed)
Patient transported to CT 

## 2022-04-28 NOTE — ED Provider Notes (Signed)
[facility] EMERGENCY DEPARTMENT Provider Note   CSN: [id number] Arrival date & time: 04/28/22  2158     History  Chief Complaint  Patient presents with   Abdominal Pain    Phillip Hansen is a 31 y.o. male.   Abdominal Pain   This patient is a 31 year old male, he has no significant chronic medical problems and states that he takes no daily medications until he started taking Tylenol this week.  Over the last 4 to 7 days he has developed some increasing right lower quadrant discomfort which seems to come and go.  There is a mild underlying low-level pain followed by some sharp and stabbing pain that last for about 10 minutes and then goes away.  He does not have any nausea or vomiting, no diarrhea, has not felt feverish and has only been taking Tylenol for the pain.  He reports that there is a subtle redness in that area, he is here because his wife wanted him to be evaluated.  Denies prior abdominal surgical history  Home Medications Prior to Admission medications   Medication Sig Start Date End Date Taking? Authorizing Provider  doxycycline (VIBRAMYCIN) 100 MG capsule Take 1 capsule (100 mg total) by mouth 2 (two) times daily. 04/28/22  Yes Eber Hong, MD  ibuprofen (ADVIL) 600 MG tablet Take 1 tablet (600 mg total) by mouth 3 (three) times daily. 08/22/20   Burgess Amor, PA-C  Melatonin 3 MG TABS Take 1 tablet by mouth at bedtime as needed.    [provider]  oxyCODONE-acetaminophen (ROXICET) 5-325 MG per tablet Take 1 tablet by mouth every 4 (four) hours as needed for severe pain. 01/06/15   Janne Napoleon, NP  phenazopyridine (PYRIDIUM) 200 MG tablet Take 1 tablet (200 mg total) by mouth 3 (three) times daily as needed for pain. 01/27/15   Marcine Matar, MD  promethazine (PHENERGAN) 25 MG tablet Take 1 tablet (25 mg total) by mouth every 6 (six) hours as needed for nausea or vomiting. 01/06/15   Janne Napoleon, NP  sulfamethoxazole-trimethoprim (BACTRIM DS,SEPTRA DS)  800-160 MG per tablet Take 1 tablet by mouth 2 (two) times daily. 01/27/15   Marcine Matar, MD      Allergies    Vantin [cefpodoxime]    Review of Systems   Review of Systems  Gastrointestinal:  Positive for abdominal pain.  All other systems reviewed and are negative.   Physical Exam Updated Vital Signs BP 131/83   Pulse 79   Temp 98.2 F (36.8 C) (Oral)   Resp 17   Ht 1.829 m (6')   Wt 72.6 kg   SpO2 100%   BMI 21.70 kg/m  Physical Exam Vitals and nursing note reviewed.  Constitutional:      General: He is not in acute distress.    Appearance: He is well-developed.  HENT:     Head: Normocephalic and atraumatic.     Mouth/Throat:     Pharynx: No oropharyngeal exudate.  Eyes:     General: No scleral icterus.       Right eye: No discharge.        Left eye: No discharge.     Conjunctiva/sclera: Conjunctivae normal.     Pupils: Pupils are equal, round, and reactive to light.  Neck:     Thyroid: No thyromegaly.     Vascular: No JVD.  Cardiovascular:     Rate and Rhythm: Normal rate and regular rhythm.     Heart sounds: Normal heart sounds.  No murmur heard.    No friction rub. No gallop.  Pulmonary:     Effort: Pulmonary effort is normal. No respiratory distress.     Breath sounds: Normal breath sounds. No wheezing or rales.  Abdominal:     General: Bowel sounds are normal. There is no distension.     Palpations: Abdomen is soft. There is no mass.     Tenderness: There is abdominal tenderness.     Comments: The skin of the abdomen below the umbilicus and above the inguinal region has a slight erythematous hue starting at the midline and crossing just over the midline down into the pubic hair and over towards the right hip wrapping around to the right flank.  It is not quite dermatomal there is no vesicular lesions there is no petechiae or purpura and is just a very subtle erythematous appearance  Musculoskeletal:        General: No tenderness. Normal range of  motion.     Cervical back: Normal range of motion and neck supple.  Lymphadenopathy:     Cervical: No cervical adenopathy.  Skin:    General: Skin is warm and dry.     Findings: No erythema or rash.  Neurological:     General: No focal deficit present.     Mental Status: He is alert.     Coordination: Coordination normal.  Psychiatric:        Behavior: Behavior normal.     ED Results / Procedures / Treatments   Labs (all labs ordered are listed, but only abnormal results are displayed) Labs Reviewed  BASIC METABOLIC PANEL - Abnormal; Notable for the following components:      Result Value   Potassium 3.4 (*)    Calcium 8.8 (*)    All other components within normal limits  CBC WITH DIFFERENTIAL/PLATELET    EKG None  Radiology No results found.  Procedures Procedures    Medications Ordered in ED Medications - No data to display  ED Course/ Medical Decision Making/ A&P                           Medical Decision Making Amount and/or Complexity of Data Reviewed Labs: ordered. Radiology: ordered.   The abdomen is minimally tender in fact he does not have a Rovsing sign and no other abdominal tenderness except for the right abdomen where there is this erythematous area of the skin suggestive of more of a skin or dermatologic process.  He does recall having the chickenpox as a child but this does not have any vesicular lesions and is quite wide in its distribution on the right side suggesting it is not a dermatomal or zoster type process.  Would consider cellulitis, less likely to be a kidney stone though he has had kidney stones in the past.  We will get a CT abdomen and pelvis, CBC and metabolic panel.  The patient is agreeable.  At the time of change of shift - care siged out to Dr. Pilar Plate to f/u results of CT renal study and disposition accordingly.        Final Clinical Impression(s) / ED Diagnoses Final diagnoses:  Right lower quadrant abdominal pain      Eber Hong, MD 04/28/22 2327

## 2022-04-28 NOTE — ED Triage Notes (Signed)
RLQ pain for appx 1 week that has been gradually increasing. At first he thought it was a kidney stone but states it feels different. Pain with palpation and feels there is swelling .

## 2022-04-28 NOTE — Discharge Instructions (Addendum)
Your testing did not show any specific abnormalities, your blood work was reassuring and unremarkable.  The CT scan did not show any signs of acute appendicitis or kidney stones or other surgical causes.  Please take the doxycycline twice a day as prescribed, see your doctor within 1 week if no better but come back to the ER immediately for severe or worsening symptoms

## 2022-04-28 NOTE — ED Notes (Signed)
ED Provider at bedside. 

## 2024-10-09 ENCOUNTER — Other Ambulatory Visit: Payer: Self-pay

## 2024-10-09 ENCOUNTER — Emergency Department (HOSPITAL_COMMUNITY)

## 2024-10-09 ENCOUNTER — Encounter (HOSPITAL_COMMUNITY): Payer: Self-pay | Admitting: *Deleted

## 2024-10-09 ENCOUNTER — Emergency Department (HOSPITAL_COMMUNITY)
Admission: EM | Admit: 2024-10-09 | Discharge: 2024-10-09 | Disposition: A | Discharge status: Home / Self Care | Attending: Emergency Medicine | Admitting: Emergency Medicine

## 2024-10-09 DIAGNOSIS — N132 Hydronephrosis with renal and ureteral calculous obstruction: Secondary | ICD-10-CM | POA: Insufficient documentation

## 2024-10-09 DIAGNOSIS — R001 Bradycardia, unspecified: Secondary | ICD-10-CM | POA: Diagnosis not present

## 2024-10-09 DIAGNOSIS — R1032 Left lower quadrant pain: Secondary | ICD-10-CM | POA: Diagnosis present

## 2024-10-09 DIAGNOSIS — N201 Calculus of ureter: Secondary | ICD-10-CM

## 2024-10-09 LAB — CBC WITH DIFFERENTIAL/PLATELET
Abs Immature Granulocytes: 0.02 K/uL (ref 0.00–0.07)
Basophils Absolute: 0.1 K/uL (ref 0.0–0.1)
Basophils Relative: 1 %
Eosinophils Absolute: 0.2 K/uL (ref 0.0–0.5)
Eosinophils Relative: 2 %
HCT: 36.9 % — ABNORMAL LOW (ref 39.0–52.0)
Hemoglobin: 12.1 g/dL — ABNORMAL LOW (ref 13.0–17.0)
Immature Granulocytes: 0 %
Lymphocytes Relative: 21 %
Lymphs Abs: 1.6 K/uL (ref 0.7–4.0)
MCH: 30.9 pg (ref 26.0–34.0)
MCHC: 32.8 g/dL (ref 30.0–36.0)
MCV: 94.1 fL (ref 80.0–100.0)
Monocytes Absolute: 0.5 K/uL (ref 0.1–1.0)
Monocytes Relative: 7 %
Neutro Abs: 5.2 K/uL (ref 1.7–7.7)
Neutrophils Relative %: 69 %
Platelets: 302 K/uL (ref 150–400)
RBC: 3.92 MIL/uL — ABNORMAL LOW (ref 4.22–5.81)
RDW: 13.1 % (ref 11.5–15.5)
WBC: 7.6 K/uL (ref 4.0–10.5)
nRBC: 0 % (ref 0.0–0.2)

## 2024-10-09 LAB — URINALYSIS, ROUTINE W REFLEX MICROSCOPIC
Bacteria, UA: NONE SEEN
Bilirubin Urine: NEGATIVE
Glucose, UA: NEGATIVE mg/dL
Ketones, ur: NEGATIVE mg/dL
Leukocytes,Ua: NEGATIVE
Nitrite: NEGATIVE
Protein, ur: NEGATIVE mg/dL
Specific Gravity, Urine: >1.046 — ABNORMAL HIGH (ref 1.005–1.030)
pH: 5 (ref 5.0–8.0)

## 2024-10-09 LAB — COMPREHENSIVE METABOLIC PANEL WITH GFR
ALT: 11 U/L (ref 0–44)
AST: 18 U/L (ref 15–41)
Albumin: 4.4 g/dL (ref 3.5–5.0)
Alkaline Phosphatase: 102 U/L (ref 38–126)
Anion gap: 6 (ref 5–15)
BUN: 12 mg/dL (ref 6–20)
CO2: 30 mmol/L (ref 22–32)
Calcium: 8.9 mg/dL (ref 8.9–10.3)
Chloride: 105 mmol/L (ref 98–111)
Creatinine, Ser: 0.94 mg/dL (ref 0.61–1.24)
GFR, Estimated: >60 mL/min
Glucose, Bld: 121 mg/dL — ABNORMAL HIGH (ref 70–99)
Potassium: 4.1 mmol/L (ref 3.5–5.1)
Sodium: 141 mmol/L (ref 135–145)
Total Bilirubin: <0.2 mg/dL (ref 0.0–1.2)
Total Protein: 6.5 g/dL (ref 6.5–8.1)

## 2024-10-09 LAB — LIPASE, BLOOD: Lipase: 16 U/L (ref 11–51)

## 2024-10-09 MED ORDER — HYDROMORPHONE HCL 1 MG/ML IJ SOLN
1.0000 mg | Freq: Once | INTRAMUSCULAR | Status: AC
  Administered 2024-10-09: 1 mg via INTRAVENOUS
  Filled 2024-10-09: qty 1

## 2024-10-09 MED ORDER — ONDANSETRON HCL 4 MG PO TABS: 4.0000 mg | ORAL_TABLET | Freq: Four times a day (QID) | ORAL | 0 refills | Status: AC

## 2024-10-09 MED ORDER — TAMSULOSIN HCL 0.4 MG PO CAPS: 0.4000 mg | ORAL_CAPSULE | Freq: Every day | ORAL | 0 refills | Status: AC

## 2024-10-09 MED ORDER — IOHEXOL 300 MG/ML  SOLN
100.0000 mL | Freq: Once | INTRAMUSCULAR | Status: AC | PRN
  Administered 2024-10-09: 100 mL via INTRAVENOUS

## 2024-10-09 MED ORDER — ONDANSETRON HCL 4 MG/2ML IJ SOLN
4.0000 mg | Freq: Once | INTRAMUSCULAR | Status: AC
  Administered 2024-10-09: 4 mg via INTRAVENOUS
  Filled 2024-10-09: qty 2

## 2024-10-09 MED ORDER — SODIUM CHLORIDE 0.9 % IV BOLUS
1000.0000 mL | Freq: Once | INTRAVENOUS | Status: AC
  Administered 2024-10-09: 1000 mL via INTRAVENOUS

## 2024-10-09 MED ORDER — KETOROLAC TROMETHAMINE 10 MG PO TABS: 10.0000 mg | ORAL_TABLET | Freq: Four times a day (QID) | ORAL | 0 refills | Status: AC | PRN

## 2024-10-09 NOTE — Discharge Instructions (Addendum)
 Your urine does not show signs of infection.  Begin taking Flomax  as prescribed.  May take Toradol  every 6 hours as needed for pain.  Do not take extra ibuprofen  at the same time.  May take Zofran  every 6-8 hours as needed for nausea/vomiting.  Stay hydrated and drink plenty of fluids.  Please follow-up with urologist listed for continued pain and symptoms as needed if no improvement.  Return to ED sooner if any symptoms worsen including new fevers, severe worsening abdominal or flank pain, uncontrollable nausea/vomiting over the medication.

## 2024-10-09 NOTE — ED Notes (Signed)
 Pt/family received d/c paperwork at this time. After going over the paperwork any questions, comments, or concerns were answered to the best of this nurse's knowledge. The pt/family verbally acknowledged the teachings/instructions.

## 2024-10-09 NOTE — ED Triage Notes (Signed)
 Pt with abd pain with N/V x 4-5 hours. Denies any sick contacts.

## 2024-10-09 NOTE — ED Provider Notes (Signed)
 " [facility] AT [facility] Provider Note   CSN: [id number] Arrival date & time: 10/09/24  1601     Patient presents with: Abdominal Pain   Phillip Hansen is a 34 y.o. male.  Patient is a 34 year old male with a history of previous kidney stones who presents to the ED for abdominal pain with associated nausea that began around noon today.  Patient notes pain began after eating lunch.  He states pain is persistent worse on the left lower side.  Pain does not radiate.  Notes pain is 10 out of 10.  He states he has not had any abdominal surgeries previously.  Denies known sick contacts or any changes in diet today.  States last bowel movement was this morning and was normal.  Denies alcohol or drug use, notes he has had to be on the Suboxone previously.  Denies fevers, chills, headache, chest pain, shortness of breath, vomiting/diarrhea, constipation, urinary symptoms.  No further complaints.    Abdominal Pain Associated symptoms: chills and nausea   Associated symptoms: no chest pain, no constipation, no diarrhea, no fever, no shortness of breath and no vomiting        Prior to Admission medications  Medication Sig Start Date End Date Taking? Authorizing Provider  ketorolac  (TORADOL ) 10 MG tablet Take 1 tablet (10 mg total) by mouth every 6 (six) hours as needed. 10/09/24  Yes Annisa Mazzarella, Thersia RAMAN, PA-C  ondansetron  (ZOFRAN ) 4 MG tablet Take 1 tablet (4 mg total) by mouth every 6 (six) hours. 10/09/24  Yes Christol Thetford, Thersia RAMAN, PA-C  tamsulosin  (FLOMAX ) 0.4 MG CAPS capsule Take 1 capsule (0.4 mg total) by mouth daily for 15 days. 10/09/24 10/24/24 Yes Aurel Nguyen, Thersia RAMAN, PA-C  doxycycline  (VIBRAMYCIN ) 100 MG capsule Take 1 capsule (100 mg total) by mouth 2 (two) times daily. 04/28/22   Cleotilde Rogue, MD  ibuprofen  (ADVIL ) 600 MG tablet Take 1 tablet (600 mg total) by mouth 3 (three) times daily. 08/22/20   Idol, Julie, PA-C  Melatonin 3 MG TABS Take 1 tablet by mouth at bedtime as  needed.    [provider]  oxyCODONE -acetaminophen  (ROXICET) 5-325 MG per tablet Take 1 tablet by mouth every 4 (four) hours as needed for severe pain. 01/06/15   Jamelle Lorrayne HERO, NP  phenazopyridine  (PYRIDIUM ) 200 MG tablet Take 1 tablet (200 mg total) by mouth 3 (three) times daily as needed for pain. 01/27/15   Matilda Senior, MD  promethazine  (PHENERGAN ) 25 MG tablet Take 1 tablet (25 mg total) by mouth every 6 (six) hours as needed for nausea or vomiting. 01/06/15   Jamelle Lorrayne HERO, NP  sulfamethoxazole -trimethoprim  (BACTRIM  DS,SEPTRA  DS) 800-160 MG per tablet Take 1 tablet by mouth 2 (two) times daily. 01/27/15   Matilda Senior, MD    Allergies: Vantin [cefpodoxime]    Review of Systems  Constitutional:  Positive for chills. Negative for fever.  Respiratory:  Negative for shortness of breath.   Cardiovascular:  Negative for chest pain.  Gastrointestinal:  Positive for abdominal pain and nausea. Negative for constipation, diarrhea and vomiting.  Neurological:  Negative for headaches.  All other systems reviewed and are negative.   Updated Vital Signs BP 131/87   Pulse 73   Temp (!) 96.4 F (35.8 C) (Temporal)   Resp 15   Ht 6' (1.829 m)   Wt 72.6 kg   SpO2 98%   BMI 21.70 kg/m   Physical Exam Constitutional:      Appearance: He  is well-developed.     Comments: Diaphoretic lying on the bed.  HENT:     Head: Normocephalic and atraumatic.  Cardiovascular:     Rate and Rhythm: Bradycardia present.  Pulmonary:     Effort: Pulmonary effort is normal.  Abdominal:     General: Bowel sounds are normal.     Tenderness: There is abdominal tenderness in the periumbilical area and left lower quadrant. Negative signs include Murphy's sign and McBurney's sign.     Comments: Guarding of the abdomen present  Neurological:     Mental Status: He is alert and oriented to person, place, and time.  Psychiatric:        Mood and Affect: Mood normal.        Behavior: Behavior  normal.     (all labs ordered are listed, but only abnormal results are displayed) Labs Reviewed  COMPREHENSIVE METABOLIC PANEL WITH GFR - Abnormal; Notable for the following components:      Result Value   Glucose, Bld 121 (*)    All other components within normal limits  URINALYSIS, ROUTINE W REFLEX MICROSCOPIC - Abnormal; Notable for the following components:   Color, Urine STRAW (*)    Specific Gravity, Urine >1.046 (*)    Hgb urine dipstick MODERATE (*)    All other components within normal limits  CBC WITH DIFFERENTIAL/PLATELET - Abnormal; Notable for the following components:   RBC 3.92 (*)    Hemoglobin 12.1 (*)    HCT 36.9 (*)    All other components within normal limits  LIPASE, BLOOD    EKG: None  Radiology: CT ABDOMEN PELVIS W CONTRAST Result Date: 10/09/2024 EXAM: CT ABDOMEN AND PELVIS WITH CONTRAST 10/09/2024 04:50:56 PM TECHNIQUE: CT of the abdomen and pelvis was performed with the administration of 100 mL of iohexol  (OMNIPAQUE ) 300 MG/ML solution. Multiplanar reformatted images are provided for review. Automated exposure control, iterative reconstruction, and/or weight-based adjustment of the mA/kV was utilized to reduce the radiation dose to as low as reasonably achievable. COMPARISON: Comparison with 04/28/2022. CLINICAL HISTORY: LLQ abdominal pain. FINDINGS: LOWER CHEST: Mild dependent atelectasis in the lung bases. Focal area of peribronchial infiltration in the inferior right middle lung, possibly early bronchopneumonia. LIVER: The liver is unremarkable. GALLBLADDER AND BILE DUCTS: Gallbladder is unremarkable. No biliary ductal dilatation. SPLEEN: No acute abnormality. PANCREAS: No acute abnormality. ADRENAL GLANDS: No acute abnormality. KIDNEYS, URETERS AND BLADDER: The left nephrogram appears slightly delayed in comparison to the right. There is mild hydronephrosis and hydroureter on the left. A 3 mm stone is present in the mid left ureter at the level of L3-L4. No  perinephric or periureteral stranding. The urinary bladder is decompressed. GI AND BOWEL: The stomach is filled with ingested material and demonstrates no acute abnormality. The small bowel and colon are not abnormally distended. The appendix is normal. There is no bowel obstruction. PERITONEUM AND RETROPERITONEUM: No ascites. No free air. VASCULATURE: Aorta is normal in caliber. LYMPH NODES: No lymphadenopathy. REPRODUCTIVE ORGANS: The prostate gland is not enlarged. BONES AND SOFT TISSUES: No acute osseous abnormality. No focal soft tissue abnormality. IMPRESSION: 1. Mild left hydronephrosis and hydroureter with a 3 mm stone in the mid left ureter at the level of L3-L4. 2. Focal peribronchial infiltration in the inferior right middle lung, possibly early bronchopneumonia. Electronically signed by: Elsie Gravely MD 10/09/2024 05:18 PM EST RP Workstation: [id number]      Medications Ordered in the ED  sodium chloride  0.9 % bolus 1,000 mL (0 mLs Intravenous  Stopped 10/09/24 1854)  ondansetron  (ZOFRAN ) injection 4 mg (4 mg Intravenous Given 10/09/24 1625)  HYDROmorphone  (DILAUDID ) injection 1 mg (1 mg Intravenous Given 10/09/24 1625)  iohexol  (OMNIPAQUE ) 300 MG/ML solution 100 mL (100 mLs Intravenous Contrast Given 10/09/24 1645)                                   Medical Decision Making Patient is a 34 year old male with a history of previous kidney stone who presents to the ED for abdominal pain with associated nausea that began around noon today.  Please see detailed HPI above.  On exam patient is alert and uncomfortable lying in the bed.  Physical exam as noted above.  Diffuse left lower quadrant tenderness appreciated.  Differential includes acute viral illness, gastritis, constipation, bowel obstruction, nephrolithiasis, UTI, acute abdomen.  Lab workup reassuring including no leukocytosis and normal kidney function.  UA shows hematuria but no acute signs of infection.  CT abdomen pelvis obtained that  shows a 3 mm right mid ureter stone on the left with mild hydronephrosis.  There is concerns for potential right middle lung bronchopneumonia developing.  He has no acute associated symptoms so less concerns for infectious process.  Suspect patient's pain is secondary to kidney stone today.  He was given Dilaudid , Zofran , a liter of fluids and states he is feeling much better.  He is tolerating oral intake.  There is no acute signs of pyelonephritis or infection, he is afebrile.  Stable for discharge home.  Prescribed Toradol  for pain.  Will withhold narcotics as he is on Suboxone.  Prescribed Flomax  and Zofran .  Symptomatic care discussed.  Resources for urology follow-up provided for continued symptoms as needed.  Return precautions provided for worsening symptoms.  Amount and/or Complexity of Data Reviewed Labs: ordered. Radiology: ordered.  Risk Prescription drug management.      Final diagnoses:  Ureterolithiasis    ED Discharge Orders          Ordered    ketorolac  (TORADOL ) 10 MG tablet  Every 6 hours PRN        10/09/24 1846    ondansetron  (ZOFRAN ) 4 MG tablet  Every 6 hours        10/09/24 1846    tamsulosin  (FLOMAX ) 0.4 MG CAPS capsule  Daily        10/09/24 1846               Neysa Thersia RAMAN, [place] 10/09/24 1923  "
